# Patient Record
Sex: Male | Born: 1985 | Race: White | Hispanic: No | Marital: Married | Smoking: Current every day smoker
Health system: Southern US, Community
[De-identification: ages and names within clinical notes are randomized; demographics above are authoritative.]

## PROBLEM LIST (undated history)

## (undated) HISTORY — PX: TENDON REPAIR: SHX5111

---

## 2010-11-02 ENCOUNTER — Emergency Department: Payer: Self-pay | Admitting: Internal Medicine

## 2011-06-10 ENCOUNTER — Emergency Department: Payer: Self-pay | Admitting: Emergency Medicine

## 2014-10-20 ENCOUNTER — Emergency Department (HOSPITAL_COMMUNITY): Payer: 59

## 2014-10-20 ENCOUNTER — Encounter (HOSPITAL_COMMUNITY): Payer: Self-pay | Admitting: Family Medicine

## 2014-10-20 ENCOUNTER — Emergency Department (HOSPITAL_COMMUNITY)
Admission: EM | Admit: 2014-10-20 | Discharge: 2014-10-20 | Disposition: A | Discharge status: Home / Self Care | Payer: 59 | Attending: Emergency Medicine | Admitting: Emergency Medicine

## 2014-10-20 DIAGNOSIS — Y9389 Activity, other specified: Secondary | ICD-10-CM | POA: Diagnosis not present

## 2014-10-20 DIAGNOSIS — Y990 Civilian activity done for income or pay: Secondary | ICD-10-CM | POA: Diagnosis not present

## 2014-10-20 DIAGNOSIS — Z23 Encounter for immunization: Secondary | ICD-10-CM | POA: Insufficient documentation

## 2014-10-20 DIAGNOSIS — Z720 Tobacco use: Secondary | ICD-10-CM | POA: Diagnosis not present

## 2014-10-20 DIAGNOSIS — W11XXXA Fall on and from ladder, initial encounter: Secondary | ICD-10-CM | POA: Insufficient documentation

## 2014-10-20 DIAGNOSIS — S61011A Laceration without foreign body of right thumb without damage to nail, initial encounter: Secondary | ICD-10-CM | POA: Insufficient documentation

## 2014-10-20 DIAGNOSIS — S161XXA Strain of muscle, fascia and tendon at neck level, initial encounter: Secondary | ICD-10-CM | POA: Insufficient documentation

## 2014-10-20 DIAGNOSIS — W19XXXA Unspecified fall, initial encounter: Secondary | ICD-10-CM

## 2014-10-20 DIAGNOSIS — Y9289 Other specified places as the place of occurrence of the external cause: Secondary | ICD-10-CM | POA: Insufficient documentation

## 2014-10-20 MED ORDER — TETANUS-DIPHTH-ACELL PERTUSSIS 5-2.5-18.5 LF-MCG/0.5 IM SUSP
0.5000 mL | Freq: Once | INTRAMUSCULAR | Status: AC
  Administered 2014-10-20: 0.5 mL via INTRAMUSCULAR
  Filled 2014-10-20: qty 0.5

## 2014-10-20 MED ORDER — HYDROCODONE-ACETAMINOPHEN 5-325 MG PO TABS
1.0000 | ORAL_TABLET | Freq: Once | ORAL | Status: AC
  Administered 2014-10-20: 1 via ORAL
  Filled 2014-10-20: qty 1

## 2014-10-20 MED ORDER — LIDOCAINE HCL (PF) 1 % IJ SOLN
10.0000 mL | Freq: Once | INTRAMUSCULAR | Status: AC
  Administered 2014-10-20: 10 mL
  Filled 2014-10-20: qty 10

## 2014-10-20 MED ORDER — HYDROCODONE-ACETAMINOPHEN 5-325 MG PO TABS: 1.0000 | ORAL_TABLET | Freq: Four times a day (QID) | ORAL | Status: DC | PRN

## 2014-10-20 MED ORDER — NAPROXEN 500 MG PO TABS: 500.0000 mg | ORAL_TABLET | Freq: Two times a day (BID) | ORAL | Status: DC | PRN

## 2014-10-20 NOTE — ED Provider Notes (Signed)
CSN: [id number]     Arrival date & time 10/20/14  1731 History  This chart was scribed for non-physician practitioner, Allen Derry, PA-C working with Arby Barrette, MD, by Abel Presto, ED Scribe. This patient was seen in room TR06C/TR06C and the patient's care was started at 6:15 PM.        Chief Complaint  Patient presents with  . Fall  . Laceration     Patient is a 29 y.o. male presenting with fall and skin laceration. The history is provided by the patient. No language interpreter was used.  Fall This is a new problem. The current episode started 3 to 5 hours ago. The problem occurs constantly. The problem has not changed since onset.Pertinent negatives include no chest pain, no abdominal pain, no headaches and no shortness of breath. Exacerbated by: movement. Nothing relieves the symptoms. He has tried ASA for the symptoms. The treatment provided no relief.  Laceration Location:  Finger Finger laceration location:  R thumb Length (cm):  0.5 cm Bleeding: controlled   Time since incident:  3 hours Laceration mechanism:  Metal edge Foreign body present:  Unable to specify Relieved by:  Nothing Worsened by:  Movement Tetanus status:  Unknown  HPI Comments: Jackson May is a 29 y.o. male who presents to the Emergency Department complaining of fall with onset around 3 PM. Pt was on an 8 ft ladder, approximately 6 ft up working on some wire when he cut his right hand on what he was working on causing him to left go, slip and fall. Pt states he landed on his back on a toolbag. He notes associated stinging intermittent 6-7/10 neck pain. He states it feels like a crick in his neck. He states it does not radiate but worsens with movement.  Pt notes associated laceration to right thumb and the bleeding is currently controlled. Pt is able to move thumb. He is not on any blood thinners nor does he have bleeding disorders. He took ASA for relief without relief. Pt is right handed.  He is unsure of his tetanus status. Pt denies head injury, LOC, headaches, lightheadedness, dizziness, vision changes, fever, chills, nausea, vomiting, diarrhea, abdominal pain, constipation, bowel or urinary incontinence, hematochezia, melena, chest pain, SOB, difficulty urinating, dysuria, hematuria, cauda equina symptoms, numbness, tingling, and weakness.    History reviewed. No pertinent past medical history. History reviewed. No pertinent past surgical history. History reviewed. No pertinent family history. History  Substance Use Topics  . Smoking status: Current Every Day Smoker  . Smokeless tobacco: Not on file  . Alcohol Use: No    Review of Systems  Constitutional: Negative for fever and chills.  HENT: Negative for rhinorrhea.   Eyes: Negative for visual disturbance.  Respiratory: Negative for shortness of breath.   Cardiovascular: Negative for chest pain.  Gastrointestinal: Negative for nausea, vomiting, abdominal pain, diarrhea and bowel incontinence.  Genitourinary: Negative for bladder incontinence, dysuria, hematuria and difficulty urinating.  Musculoskeletal: Positive for myalgias, arthralgias (R thumb) and neck pain. Negative for back pain and gait problem.  Skin: Positive for wound.  Allergic/Immunologic: Negative for immunocompromised state.  Neurological: Negative for dizziness, syncope, weakness, light-headedness, numbness and headaches.  Hematological: Does not bruise/bleed easily.   A complete 10 system review of systems was obtained and all systems are negative except as noted in the HPI and PMH.     Allergies  Review of patient's allergies indicates no known allergies.  Home Medications   Prior to Admission medications  Not on File   BP 134/87 mmHg  Pulse 66  Temp(Src) 98.7 F (37.1 C) (Oral)  SpO2 97% Physical Exam  Constitutional: He is oriented to person, place, and time. Vital signs are normal. He appears well-developed and well-nourished.   Non-toxic appearance. No distress. Cervical collar in place.  Afebrile, nontoxic, NAD. C collar in place  HENT:  Head: Normocephalic and atraumatic. Head is without raccoon's eyes, without Battle's sign, without abrasion and without contusion.  Mouth/Throat: Mucous membranes are normal.  [place]/AT, no raccoon eyes or battle's sign, no abrasions or contusions, no scalp tenderness or crepitus  Eyes: Conjunctivae and EOM are normal. Pupils are equal, round, and reactive to light. Right eye exhibits no discharge. Left eye exhibits no discharge.  PERRL, EOMI, no nystagmus, no visual field deficits   Neck: Spinous process tenderness present. No muscular tenderness present.  C collar in place but mild spinous process TTP near C2-3, no bony stepoffs or deformities, no paraspinous muscle TTP or muscle spasms. No bruising or swelling.   Cardiovascular: Normal rate and intact distal pulses.   Pulmonary/Chest: Effort normal. No respiratory distress. He exhibits no tenderness, no crepitus, no deformity and no retraction.  No chest wall tenderness, crepitus, retraction, or deformity  Abdominal: Soft. Normal appearance. He exhibits no distension. There is no tenderness. There is no rigidity, no rebound and no guarding.  Soft, NTND, no r/g/r, no bruising  Musculoskeletal: Normal range of motion.       Thoracic back: Normal.       Lumbar back: Normal.       Right hand: He exhibits tenderness and laceration (0.5 cm). He exhibits normal range of motion, normal two-point discrimination, normal capillary refill and no swelling. Normal sensation noted. Normal strength noted.  Cervical spine as noted above. All other spinal levels nonTTP without step offs or deformities, no bruising or abrasions.  MAE x4 Strength and sensation grossly intact Distal pulses intact R thumb with mild TTP along proximal phalanx with ~0.5 cm V-shaped laceration to dorsal aspect of proximal phalanx, no retained FB, no ongoing bleeding. SEE  PICTURE BELOW. FROM intact in all joints, cap refill brisk and present.  Neurological: He is alert and oriented to person, place, and time. He has normal strength. No cranial nerve deficit or sensory deficit. Gait normal. GCS eye subscore is 4. GCS verbal subscore is 5. GCS motor subscore is 6.  CN 2-12 grossly intact A&O x4 GCS 15 Sensation and strength intact Gait nonataxic Coordination WNL  Skin: Skin is warm and dry. Laceration noted. No rash noted.  V shaped laceration to R thumb, as described above. SEE PICTURE BELOW  Psychiatric: He has a normal mood and affect.  Nursing note and vitals reviewed.     ED Course  LACERATION REPAIR Date/Time: 10/20/2014 7:30 PM Performed by: Allen Derry Authorized by: Allen Derry Consent: Verbal consent obtained. Risks and benefits: risks, benefits and alternatives were discussed Consent given by: patient Patient understanding: patient states understanding of the procedure being performed Patient consent: the patient's understanding of the procedure matches consent given Patient identity confirmed: verbally with patient Body area: upper extremity Location details: right thumb Laceration length: 1 cm Foreign bodies: no foreign bodies Tendon involvement: none Nerve involvement: none Vascular damage: no Anesthesia: local infiltration Local anesthetic: lidocaine 1% without epinephrine Anesthetic total: 1 ml Patient sedated: no Preparation: Patient was prepped and draped in the usual sterile fashion. Irrigation solution: saline Irrigation method: syringe Amount of cleaning: standard Debridement: none Degree  of undermining: none Skin closure: 5-0 Prolene Number of sutures: 1 Technique: simple Approximation: close Approximation difficulty: simple Dressing: 4x4 sterile gauze and splint Patient tolerance: Patient tolerated the procedure well with no immediate complications   (including critical care  time) DIAGNOSTIC STUDIES: Oxygen Saturation is 97% on room air, normal by my interpretation.    COORDINATION OF CARE: 6:23 PM Discussed treatment plan with patient at beside, the patient agrees with the plan and has no further questions at this time.   Labs Review Labs Reviewed - No data to display  Imaging Review Dg Cervical Spine Complete  10/20/2014   CLINICAL DATA:  Initial encounter for 6 foot fall from ladder today onto ground. Neck pain.  EXAM: CERVICAL SPINE  4+ VIEWS  COMPARISON:  11/02/2010.  FINDINGS: There is no evidence of cervical spine fracture or prevertebral soft tissue swelling. Alignment is normal. No other significant bone abnormalities are identified.  IMPRESSION: No cervical spine fracture.  Loss of cervical lordosis. This can be related to patient positioning, muscle spasm or soft tissue injury.   Electronically Signed   By: Kennith CenterEric  Mansell M.D.   On: 10/20/2014 19:04   Dg Finger Thumb Right  10/20/2014   CLINICAL DATA:  Status post fall today. Right thumb pain and laceration. Initial encounter.  EXAM: RIGHT THUMB 2+V  COMPARISON:  None.  FINDINGS: There is no evidence of fracture or dislocation. There is no evidence of arthropathy or other focal bone abnormality. Soft tissues are unremarkable  IMPRESSION: Negative exam.   Electronically Signed   By: Drusilla Kannerhomas  Dalessio M.D.   On: 10/20/2014 19:03     EKG Interpretation None      MDM   Final diagnoses:  Fall  Neck strain, initial encounter  Thumb laceration, right, initial encounter    29 y.o. male here with fall from a ladder, fell onto a toolbag with his back, c/o neck pain and small lac to R thumb. Tenderness to cervical spine and mild tenderness to R thumb, no other tenderness elsewhere. No red flag s/s of back pain. No s/s of central cord compression or cauda equina. Lower extremities are neurovascularly intact and patient is ambulating without difficulty. Will update tetanus, obtain xray imaging, and repair with  sutures. No head inj or LOC, nonfocal neuro, doubt need for head CT. Will give pain meds and reassess.   I personally performed the services described in this documentation, which was scribed in my presence. The recorded information has been reviewed and is accurate.  7:31 PM Xrays neg. Lac repaired. Will have him f/up with UC or his PCP in 7-10 days. Will send home with pain meds. Discussed splint use and RICE. I explained the diagnosis and have given explicit precautions to return to the ER including for any other new or worsening symptoms. The patient understands and accepts the medical plan as it's been dictated and I have answered their questions. Discharge instructions concerning home care and prescriptions have been given. The patient is STABLE and is discharged to home in good condition.  BP 134/87 mmHg  Pulse 66  Temp(Src) 98.7 F (37.1 C) (Oral)  SpO2 97%  Meds ordered this encounter  Medications  . lidocaine (PF) (XYLOCAINE) 1 % injection 10 mL    Sig:   . Tdap (BOOSTRIX) injection 0.5 mL    Sig:   . HYDROcodone-acetaminophen (NORCO/VICODIN) 5-325 MG per tablet 1 tablet    Sig:   . HYDROcodone-acetaminophen (NORCO) 5-325 MG per tablet    Sig:  Take 1 tablet by mouth every 6 (six) hours as needed for severe pain.    Dispense:  6 tablet    Refill:  0    Order Specific Question:  Supervising Provider    Answer:  MILLER, BRIAN [[id number]]  . naproxen (NAPROSYN) 500 MG tablet    Sig: Take 1 tablet (500 mg total) by mouth 2 (two) times daily as needed for mild pain, moderate pain or headache (TAKE WITH MEALS.).    Dispense:  20 tablet    Refill:  0    Order Specific Question:  Supervising Provider    Answer:  Eber Hong [[id number]]    Erhardt Dada Camprubi-Soms, PA-C 10/20/14 1934  Arby Barrette, MD 10/21/14 1620

## 2014-10-20 NOTE — Discharge Instructions (Signed)
Keep wound clean with mild soap and water. Keep area covered with a topical antibiotic ointment and bandage, keep bandage dry, and do not submerge in water for 24 hours. Keep splint on until sutures are removed. Ice and elevate for additional pain relief and swelling. Alternate between Naprosyn and norco for additional pain relief but don't drive or operate machinery while taking this medication. Follow up with your primary care doctor or the [facility] in approximately 7-10 days for wound recheck and suture removal. Monitor area for signs of infection to include, but not limited to: increasing pain, redness, drainage/pus, or swelling. Return to emergency department for emergent changing or worsening symptoms.    Cervical Sprain A cervical sprain is when the tissues (ligaments) that hold the neck bones in place stretch or tear. HOME CARE   Put ice on the injured area.  Put ice in a plastic bag.  Place a towel between your skin and the bag.  Leave the ice on for 15-20 minutes, 3-4 times a day.  You may have been given a collar to wear. This collar keeps your neck from moving while you heal.  Do not take the collar off unless told by your doctor.  If you have long hair, keep it outside of the collar.  Ask your doctor before changing the position of your collar. You may need to change its position over time to make it more comfortable.  If you are allowed to take off the collar for cleaning or bathing, follow your doctor's instructions on how to do it safely.  Keep your collar clean by wiping it with mild soap and water. Dry it completely. If the collar has removable pads, remove them every 1-2 days to hand wash them with soap and water. Allow them to air dry. They should be dry before you wear them in the collar.  Do not drive while wearing the collar.  Only take medicine as told by your doctor.  Keep all doctor visits as told.  Keep all physical therapy visits as  told.  Adjust your work station so that you have good posture while you work.  Avoid positions and activities that make your problems worse.  Warm up and stretch before being active. GET HELP IF:  Your pain is not controlled with medicine.  You cannot take less pain medicine over time as planned.  Your activity level does not improve as expected. GET HELP RIGHT AWAY IF:   You are bleeding.  Your stomach is upset.  You have an allergic reaction to your medicine.  You develop new problems that you cannot explain.  You lose feeling (become numb) or you cannot move any part of your body (paralysis).  You have tingling or weakness in any part of your body.  Your symptoms get worse. Symptoms include:  Pain, soreness, stiffness, puffiness (swelling), or a burning feeling in your neck.  Pain when your neck is touched.  Shoulder or upper back pain.  Limited ability to move your neck.  Headache.  Dizziness.  Your hands or arms feel week, lose feeling, or tingle.  Muscle spasms.  Difficulty swallowing or chewing. MAKE SURE YOU:   Understand these instructions.  Will watch your condition.  Will get help right away if you are not doing well or get worse. Document Released: 09/28/2007 Document Revised: 12/12/2012 Document Reviewed: 10/17/2012 [facility] Patient Information 2015 [place], [place]. This information is not intended to replace advice given to you by your health  care provider. Make sure you discuss any questions you have with your health care provider.  Laceration Care, Adult A laceration is a cut or lesion that goes through all layers of the skin and into the tissue just beneath the skin. TREATMENT  Some lacerations may not require closure. Some lacerations may not be able to be closed due to an increased risk of infection. It is important to see your caregiver as soon as possible after an injury to minimize the risk of infection and maximize the opportunity for  successful closure. If closure is appropriate, pain medicines may be given, if needed. The wound will be cleaned to help prevent infection. Your caregiver will use stitches (sutures), staples, wound glue (adhesive), or skin adhesive strips to repair the laceration. These tools bring the skin edges together to allow for faster healing and a better cosmetic outcome. However, all wounds will heal with a scar. Once the wound has healed, scarring can be minimized by covering the wound with sunscreen during the day for 1 full year. HOME CARE INSTRUCTIONS  For sutures or staples:  Keep the wound clean and dry.  If you were given a bandage (dressing), you should change it at least once a day. Also, change the dressing if it becomes wet or dirty, or as directed by your caregiver.  Wash the wound with soap and water 2 times a day. Rinse the wound off with water to remove all soap. Pat the wound dry with a clean towel.  After cleaning, apply a thin layer of the antibiotic ointment as recommended by your caregiver. This will help prevent infection and keep the dressing from sticking.  You may shower as usual after the first 24 hours. Do not soak the wound in water until the sutures are removed.  Only take over-the-counter or prescription medicines for pain, discomfort, or fever as directed by your caregiver.  Get your sutures or staples removed as directed by your caregiver. For skin adhesive strips:  Keep the wound clean and dry.  Do not get the skin adhesive strips wet. You may bathe carefully, using caution to keep the wound dry.  If the wound gets wet, pat it dry with a clean towel.  Skin adhesive strips will fall off on their own. You may trim the strips as the wound heals. Do not remove skin adhesive strips that are still stuck to the wound. They will fall off in time. For wound adhesive:  You may briefly wet your wound in the shower or bath. Do not soak or scrub the wound. Do not swim. Avoid  periods of heavy perspiration until the skin adhesive has fallen off on its own. After showering or bathing, gently pat the wound dry with a clean towel.  Do not apply liquid medicine, cream medicine, or ointment medicine to your wound while the skin adhesive is in place. This may loosen the film before your wound is healed.  If a dressing is placed over the wound, be careful not to apply tape directly over the skin adhesive. This may cause the adhesive to be pulled off before the wound is healed.  Avoid prolonged exposure to sunlight or tanning lamps while the skin adhesive is in place. Exposure to ultraviolet light in the first year will darken the scar.  The skin adhesive will usually remain in place for 5 to 10 days, then naturally fall off the skin. Do not pick at the adhesive film. You may need a tetanus shot if:  You cannot remember when you had your last tetanus shot.  You have never had a tetanus shot. If you get a tetanus shot, your arm may swell, get red, and feel warm to the touch. This is common and not a problem. If you need a tetanus shot and you choose not to have one, there is a rare chance of getting tetanus. Sickness from tetanus can be serious. SEEK MEDICAL CARE IF:   You have redness, swelling, or increasing pain in the wound.  You see a red line that goes away from the wound.  You have yellowish-white fluid (pus) coming from the wound.  You have a fever.  You notice a bad smell coming from the wound or dressing.  Your wound breaks open before or after sutures have been removed.  You notice something coming out of the wound such as wood or glass.  Your wound is on your hand or foot and you cannot move a finger or toe. SEEK IMMEDIATE MEDICAL CARE IF:   Your pain is not controlled with prescribed medicine.  You have severe swelling around the wound causing pain and numbness or a change in color in your arm, hand, leg, or foot.  Your wound splits open and  starts bleeding.  You have worsening numbness, weakness, or loss of function of any joint around or beyond the wound.  You develop painful lumps near the wound or on the skin anywhere on your body. MAKE SURE YOU:   Understand these instructions.  Will watch your condition.  Will get help right away if you are not doing well or get worse. Document Released: 04/11/2005 Document Revised: 07/04/2011 Document Reviewed: 10/05/2010 [facility] Patient Information 2015 [place], [place]. This information is not intended to replace advice given to you by your health care provider. Make sure you discuss any questions you have with your health care provider.  Cryotherapy Cryotherapy is when you put ice on your injury. Ice helps lessen pain and puffiness (swelling) after an injury. Ice works the best when you start using it in the first 24 to 48 hours after an injury. HOME CARE  Put a dry or damp towel between the ice pack and your skin.  You may press gently on the ice pack.  Leave the ice on for no more than 10 to 20 minutes at a time.  Check your skin after 5 minutes to make sure your skin is okay.  Rest at least 20 minutes between ice pack uses.  Stop using ice when your skin loses feeling (numbness).  Do not use ice on someone who cannot tell you when it hurts. This includes small children and people with memory problems (dementia). GET HELP RIGHT AWAY IF:  You have white spots on your skin.  Your skin turns blue or pale.  Your skin feels waxy or hard.  Your puffiness gets worse. MAKE SURE YOU:   Understand these instructions.  Will watch your condition.  Will get help right away if you are not doing well or get worse. Document Released: 09/28/2007 Document Revised: 07/04/2011 Document Reviewed: 12/02/2010 [facility] Patient Information 2015 [place], [place]. This information is not intended to replace advice given to you by your health care provider. Make sure you discuss any  questions you have with your health care provider.

## 2014-10-20 NOTE — ED Notes (Signed)
Pt sts he was on a ladder messing with some wire. sts he sliced thumb with the wire and the fell off the ladder. sts some neck soreness. Right thumb

## 2015-04-27 ENCOUNTER — Encounter (HOSPITAL_COMMUNITY): Payer: Self-pay | Admitting: *Deleted

## 2015-04-27 ENCOUNTER — Emergency Department (HOSPITAL_COMMUNITY)
Admission: EM | Admit: 2015-04-27 | Discharge: 2015-04-27 | Disposition: A | Discharge status: Home / Self Care | Payer: 59 | Attending: Emergency Medicine | Admitting: Emergency Medicine

## 2015-04-27 ENCOUNTER — Emergency Department (HOSPITAL_COMMUNITY): Payer: 59

## 2015-04-27 DIAGNOSIS — W010XXA Fall on same level from slipping, tripping and stumbling without subsequent striking against object, initial encounter: Secondary | ICD-10-CM | POA: Insufficient documentation

## 2015-04-27 DIAGNOSIS — F17210 Nicotine dependence, cigarettes, uncomplicated: Secondary | ICD-10-CM | POA: Diagnosis not present

## 2015-04-27 DIAGNOSIS — Y9389 Activity, other specified: Secondary | ICD-10-CM | POA: Diagnosis not present

## 2015-04-27 DIAGNOSIS — S99911A Unspecified injury of right ankle, initial encounter: Secondary | ICD-10-CM | POA: Diagnosis present

## 2015-04-27 DIAGNOSIS — Y998 Other external cause status: Secondary | ICD-10-CM | POA: Diagnosis not present

## 2015-04-27 DIAGNOSIS — Y9289 Other specified places as the place of occurrence of the external cause: Secondary | ICD-10-CM | POA: Insufficient documentation

## 2015-04-27 DIAGNOSIS — Z9889 Other specified postprocedural states: Secondary | ICD-10-CM | POA: Diagnosis not present

## 2015-04-27 DIAGNOSIS — S93401A Sprain of unspecified ligament of right ankle, initial encounter: Secondary | ICD-10-CM | POA: Insufficient documentation

## 2015-04-27 MED ORDER — TRAMADOL HCL 50 MG PO TABS: 50.0000 mg | ORAL_TABLET | Freq: Four times a day (QID) | ORAL | Status: DC | PRN

## 2015-04-27 MED ORDER — NAPROXEN 500 MG PO TABS: 500.0000 mg | ORAL_TABLET | Freq: Two times a day (BID) | ORAL | Status: DC

## 2015-04-27 NOTE — ED Notes (Signed)
Pt reports slipping and falling on porch this am. C/o right ankle pain.

## 2015-04-27 NOTE — ED Notes (Signed)
Pt verbalized understanding of no driving and to use caution within 4 hours of taking pain meds due to meds cause drowsiness 

## 2015-04-27 NOTE — Discharge Instructions (Signed)
Ankle Sprain °An ankle sprain is an injury to the strong, fibrous tissues (ligaments) that hold the bones of your ankle joint together.  °CAUSES °An ankle sprain is usually caused by a fall or by twisting your ankle. Ankle sprains most commonly occur when you step on the outer edge of your foot, and your ankle turns inward. People who participate in sports are more prone to these types of injuries.  °SYMPTOMS  °· Pain in your ankle. The pain may be present at rest or only when you are trying to stand or walk. °· Swelling. °· Bruising. Bruising may develop immediately or within 1 to 2 days after your injury. °· Difficulty standing or walking, particularly when turning corners or changing directions. °DIAGNOSIS  °Your caregiver will ask you details about your injury and perform a physical exam of your ankle to determine if you have an ankle sprain. During the physical exam, your caregiver will press on and apply pressure to specific areas of your foot and ankle. Your caregiver will try to move your ankle in certain ways. An X-ray exam may be done to be sure a bone was not broken or a ligament did not separate from one of the bones in your ankle (avulsion fracture).  °TREATMENT  °Certain types of braces can help stabilize your ankle. Your caregiver can make a recommendation for this. Your caregiver may recommend the use of medicine for pain. If your sprain is severe, your caregiver may refer you to a surgeon who helps to restore function to parts of your skeletal system (orthopedist) or a physical therapist. °HOME CARE INSTRUCTIONS  °· Apply ice to your injury for 1-2 days or as directed by your caregiver. Applying ice helps to reduce inflammation and pain. °¨ Put ice in a plastic bag. °¨ Place a towel between your skin and the bag. °¨ Leave the ice on for 15-20 minutes at a time, every 2 hours while you are awake. °· Only take over-the-counter or prescription medicines for pain, discomfort, or fever as directed by  your caregiver. °· Elevate your injured ankle above the level of your heart as much as possible for 2-3 days. °· If your caregiver recommends crutches, use them as instructed. Gradually put weight on the affected ankle. Continue to use crutches or a cane until you can walk without feeling pain in your ankle. °· If you have a plaster splint, wear the splint as directed by your caregiver. Do not rest it on anything harder than a pillow for the first 24 hours. Do not put weight on it. Do not get it wet. You may take it off to take a shower or bath. °· You may have been given an elastic bandage to wear around your ankle to provide support. If the elastic bandage is too tight (you have numbness or tingling in your foot or your foot becomes cold and blue), adjust the bandage to make it comfortable. °· If you have an air splint, you may blow more air into it or let air out to make it more comfortable. You may take your splint off at night and before taking a shower or bath. Wiggle your toes in the splint several times per day to decrease swelling. °SEEK MEDICAL CARE IF:  °· You have rapidly increasing bruising or swelling. °· Your toes feel extremely cold or you lose feeling in your foot. °· Your pain is not relieved with medicine. °SEEK IMMEDIATE MEDICAL CARE IF: °· Your toes are numb or blue. °·   You have severe pain that is increasing. MAKE SURE YOU:   Understand these instructions.  Will watch your condition.  Will get help right away if you are not doing well or get worse.   This information is not intended to replace advice given to you by your health care provider. Make sure you discuss any questions you have with your health care provider.   Document Released: 04/11/2005 Document Revised: 05/02/2014 Document Reviewed: 04/23/2011 Elsevier Interactive Patient Education 2016 [employer]   Wear the ASO and use crutches to avoid weight bearing.  Use ice and elevation as much as possible for the next  several days to help reduce the swelling.  Take the medications prescribed.  You may take the tramadol prescribed for pain relief.  This will make you drowsy - do not drive within 4 hours of taking this medication.  Use the ibuprofen also for inflammation.  Call the orthopedic doctor listed for a recheck of your injury in 10 days if your symptoms persist.

## 2015-04-27 NOTE — ED Provider Notes (Signed)
CSN: [id number]     Arrival date & time 04/27/15  1236 History   By signing my name below, I, Evon Slackerrance Branch, attest that this documentation has been prepared under the direction and in the presence of Chubb CorporationJulie Jonathyn Carothers, PA-C. Electronically Signed: Evon Slackerrance Branch, ED Scribe. 04/28/2015. 6:07 AM.      Chief Complaint  Patient presents with  . Ankle Pain   The history is provided by the patient. No language interpreter was used.   HPI Comments: Jackson May is a 30 y.o. male who presents to the Emergency Department complaining of sharp right ankle pain onset today PTA. Pt states that he slipped on the porch today due to the rain. Pt states that he is able to bear weight on the leg but its is painful with movement of the ankle. Pt states he has a Hx of right ankle fracture and surgery. He states that he has a Hx of tendon tear with subsequent repair in the right ankle and that the pain feels similar. Pt doesn't report numbness or tingling. He has had no treatment prior to arrival today.  History reviewed. No pertinent past medical history. Past Surgical History  Procedure Laterality Date  . Tendon repair      right ankle   History reviewed. No pertinent family history. Social History  Substance Use Topics  . Smoking status: Current Every Day Smoker -- 0.50 packs/day    Types: Cigarettes  . Smokeless tobacco: None  . Alcohol Use: No    Review of Systems  Musculoskeletal: Positive for arthralgias. Negative for joint swelling.  Neurological: Negative for numbness.    Allergies  Review of patient's allergies indicates no known allergies.  Home Medications   Prior to Admission medications   Medication Sig Start Date End Date Taking? Authorizing Provider  naproxen (NAPROSYN) 500 MG tablet Take 1 tablet (500 mg total) by mouth 2 (two) times daily. 04/27/15   Burgess AmorJulie Kapri Nero, PA-C  traMADol (ULTRAM) 50 MG tablet Take 1 tablet (50 mg total) by mouth every 6 (six) hours as needed. 04/27/15   Burgess AmorJulie  Jeryn Cerney, PA-C   BP 143/77 mmHg  Pulse 60  Temp(Src) 99.7 F (37.6 C) (Tympanic)  Resp 16  Ht 5' (1.524 m)  Wt 45.36 kg  BMI 19.53 kg/m2  SpO2 100%   Physical Exam  Constitutional: He appears well-developed and well-nourished.  HENT:  Head: Normocephalic.  Cardiovascular: Normal rate and intact distal pulses.  Exam reveals no decreased pulses.   Pulses:      Dorsalis pedis pulses are 2+ on the right side, and 2+ on the left side.       Posterior tibial pulses are 2+ on the right side, and 2+ on the left side.  Musculoskeletal: He exhibits tenderness. He exhibits no edema.       Right ankle: He exhibits decreased range of motion. He exhibits no swelling, no ecchymosis and normal pulse. Tenderness. Lateral malleolus tenderness found. No head of 5th metatarsal and no proximal fibula tenderness found. Achilles tendon normal.  Decreased flex/ext which is pt's baseline since prior injury, per pt report.  Neurological: He is alert. No sensory deficit.  Skin: Skin is warm, dry and intact.  Nursing note and vitals reviewed.   ED Course  Procedures (including critical care time) DIAGNOSTIC STUDIES: Oxygen Saturation is 100% on RA, normal by my interpretation.    COORDINATION OF CARE: 3:30 PM-Discussed treatment plan with pt at bedside and pt agreed to plan.     Labs  Review Labs Reviewed - No data to display  Imaging Review Dg Ankle Complete Right  04/27/2015  CLINICAL DATA:  Patient status post fall. Right ankle injury. Initial encounter. EXAM: RIGHT ANKLE - COMPLETE 3+ VIEW COMPARISON:  Ankle radiographs 06/10/2011 FINDINGS: Normal anatomic alignment. No evidence for acute fracture or dislocation. Tailor dome is intact. Remote healed anterior tailor fracture. Soft tissues are unremarkable. Midfoot degenerative changes. IMPRESSION: No acute osseous abnormality. Electronically Signed   By: Annia Belt M.D.   On: 04/27/2015 13:23      EKG Interpretation None      MDM   Final  diagnoses:  Ankle sprain, right, initial encounter        Radiological studies were viewed, interpreted and considered during the medical decision making and disposition process. I agree with radiologists reading.  Results were also discussed with patient. RICE, aso, crutches.  F/u with ortho, referral given, if sx are not improved over the next 10 days.     Burgess Amor, PA-C 04/28/15 1610  Linwood Dibbles, MD 04/30/15 [phone]

## 2016-09-28 ENCOUNTER — Emergency Department (HOSPITAL_COMMUNITY): Payer: BLUE CROSS/BLUE SHIELD

## 2016-09-28 ENCOUNTER — Encounter (HOSPITAL_COMMUNITY): Payer: Self-pay | Admitting: Emergency Medicine

## 2016-09-28 ENCOUNTER — Emergency Department (HOSPITAL_COMMUNITY)
Admission: EM | Admit: 2016-09-28 | Discharge: 2016-09-28 | Disposition: A | Discharge status: Home / Self Care | Payer: BLUE CROSS/BLUE SHIELD | Attending: Emergency Medicine | Admitting: Emergency Medicine

## 2016-09-28 DIAGNOSIS — W208XXA Other cause of strike by thrown, projected or falling object, initial encounter: Secondary | ICD-10-CM | POA: Diagnosis not present

## 2016-09-28 DIAGNOSIS — Y929 Unspecified place or not applicable: Secondary | ICD-10-CM | POA: Insufficient documentation

## 2016-09-28 DIAGNOSIS — S29019A Strain of muscle and tendon of unspecified wall of thorax, initial encounter: Secondary | ICD-10-CM

## 2016-09-28 DIAGNOSIS — Y939 Activity, unspecified: Secondary | ICD-10-CM | POA: Diagnosis not present

## 2016-09-28 DIAGNOSIS — S39012A Strain of muscle, fascia and tendon of lower back, initial encounter: Secondary | ICD-10-CM | POA: Insufficient documentation

## 2016-09-28 DIAGNOSIS — S0990XA Unspecified injury of head, initial encounter: Secondary | ICD-10-CM | POA: Diagnosis present

## 2016-09-28 DIAGNOSIS — F17210 Nicotine dependence, cigarettes, uncomplicated: Secondary | ICD-10-CM | POA: Insufficient documentation

## 2016-09-28 DIAGNOSIS — T1490XA Injury, unspecified, initial encounter: Secondary | ICD-10-CM

## 2016-09-28 DIAGNOSIS — Y999 Unspecified external cause status: Secondary | ICD-10-CM | POA: Insufficient documentation

## 2016-09-28 DIAGNOSIS — Z79899 Other long term (current) drug therapy: Secondary | ICD-10-CM | POA: Diagnosis not present

## 2016-09-28 DIAGNOSIS — S0101XA Laceration without foreign body of scalp, initial encounter: Secondary | ICD-10-CM | POA: Diagnosis not present

## 2016-09-28 DIAGNOSIS — S098XXA Other specified injuries of head, initial encounter: Secondary | ICD-10-CM

## 2016-09-28 MED ORDER — LIDOCAINE-EPINEPHRINE (PF) 2 %-1:200000 IJ SOLN
10.0000 mL | Freq: Once | INTRAMUSCULAR | Status: DC
  Filled 2016-09-28: qty 20

## 2016-09-28 NOTE — Discharge Instructions (Signed)
Staples need to be removed in 7-10 days. That can be done here, at an urgent care center, or at a doctor's office.  Take ibuprofen or naproxen as needed for pain.

## 2016-09-28 NOTE — ED Provider Notes (Signed)
[facility]EMERGENCY DEPT Provider Note   CSN: [id number] Arrival date & time: 09/28/16  1341     History   Chief Complaint Chief Complaint  Patient presents with  . Head Injury    HPI Jackson May is a 31 y.o. male.  The history is provided by the patient.  He states that a window air conditioning unit fell from a second story window striking him on the back of his head. He was momentarily dazed but there was no loss of consciousness. There was initial nausea which has resolved. Initially, he was not aware of any pain anywhere besides the back of his head, but he is now complaining of some pain in his mid cervical region and upper thoracic region. He denies weakness, numbness, tingling. He has been ambulatory. Last tetanus immunization was in 2016. He currently rates his pain at 8/10. Pain is worse with movement.  History reviewed. No pertinent past medical history.  There are no active problems to display for this patient.   Past Surgical History:  Procedure Laterality Date  . TENDON REPAIR     right ankle       Home Medications    Prior to Admission medications   Medication Sig Start Date End Date Taking? Authorizing Provider  naproxen (NAPROSYN) 500 MG tablet Take 1 tablet (500 mg total) by mouth 2 (two) times daily. 04/27/15   Burgess AmorIdol, Julie, PA-C  traMADol (ULTRAM) 50 MG tablet Take 1 tablet (50 mg total) by mouth every 6 (six) hours as needed. 04/27/15   Burgess AmorIdol, Julie, PA-C    Family History History reviewed. No pertinent family history.  Social History Social History  Substance Use Topics  . Smoking status: Current Every Day Smoker    Packs/day: 0.50    Types: Cigarettes  . Smokeless tobacco: Not on file  . Alcohol use No     Allergies   Patient has no known allergies.   Review of Systems Review of Systems  All other systems reviewed and are negative.    Physical Exam Updated Vital Signs BP (!) 144/92 (BP Location: Right Arm)   Pulse 82   Temp 98.8 F  (37.1 C) (Oral)   Resp 18   Ht 5\' 1"  (1.549 m)   Wt 44 kg (97 lb)   SpO2 100%   BMI 18.33 kg/m   Physical Exam  Nursing note and vitals reviewed.  31 year old male, resting comfortably and in no acute distress. Vital signs are significant for borderline hypertension. Oxygen saturation is 100%, which is normal. Head is normocephalic. There is a linear laceration at the base of the occiput. PERRLA, EOMI. Oropharynx is clear. Neck has minimal tenderness in the mid and lower cervical region without adenopathy or JVD. Back has mild tenderness in the interscapular area. There is no CVA tenderness. Lungs are clear without rales, wheezes, or rhonchi. Chest is nontender. Heart has regular rate and rhythm without murmur. Abdomen is soft, flat, nontender without masses or hepatosplenomegaly and peristalsis is normoactive. Extremities have no cyanosis or edema, full range of motion is present. Skin is warm and dry without rash. Neurologic: Mental status is normal, cranial nerves are intact, there are no motor or sensory deficits.  ED Treatments / Results   Radiology Dg Cervical Spine Complete  Result Date: 09/28/2016 CLINICAL DATA:  Neck stiffness since a window unit air conditioner fell on the patient today. Initial encounter. EXAM: CERVICAL SPINE - COMPLETE 4+ VIEW COMPARISON:  Plain film cervical spine 10/20/2014. FINDINGS: There  is no evidence of cervical spine fracture or prevertebral soft tissue swelling. Alignment is normal. No other significant bone abnormalities are identified. IMPRESSION: Negative cervical spine radiographs. Electronically Signed   By: Drusilla Kanner M.D.   On: 09/28/2016 16:08   Dg Thoracic Spine W/swimmers  Result Date: 09/28/2016 CLINICAL DATA:  Blunt trauma.  Back pain. EXAM: THORACIC SPINE - 3 VIEWS COMPARISON:  None. FINDINGS: There is no evidence of thoracic spine fracture. Alignment is normal. No other significant bone abnormalities are identified. IMPRESSION:  Negative. Electronically Signed   By: Elige Ko   On: 09/28/2016 16:08   Ct Head Wo Contrast  Result Date: 09/28/2016 CLINICAL DATA:  Air conditioning unit fell on head. Nausea and dizziness. Initial encounter. EXAM: CT HEAD WITHOUT CONTRAST CT CERVICAL SPINE WITHOUT CONTRAST TECHNIQUE: Multidetector CT imaging of the head and cervical spine was performed following the standard protocol without intravenous contrast. Multiplanar CT image reconstructions of the cervical spine were also generated. COMPARISON:  Cervical spine radiography 10/20/2014 FINDINGS: CT HEAD FINDINGS Brain: Normal. No evidence of acute infarction, hemorrhage, hydrocephalus, extra-axial collection or mass lesion/mass effect. Vascular: Negative. Skull: Negative for fracture Sinuses/Orbits: Negative CT CERVICAL SPINE FINDINGS Alignment: No traumatic malalignment. Skull base and vertebrae: Negative for fracture Soft tissues and spinal canal: No prevertebral fluid or swelling. No visible canal hematoma. Disc levels: No significant degenerative changes or evidence of impingement. Upper chest: Negative IMPRESSION: No evidence of intracranial or cervical spine injury. Electronically Signed   By: Marnee Spring M.D.   On: 09/28/2016 16:48   Ct Cervical Spine Wo Contrast  Result Date: 09/28/2016 CLINICAL DATA:  Air conditioning unit fell on head. Nausea and dizziness. Initial encounter. EXAM: CT HEAD WITHOUT CONTRAST CT CERVICAL SPINE WITHOUT CONTRAST TECHNIQUE: Multidetector CT imaging of the head and cervical spine was performed following the standard protocol without intravenous contrast. Multiplanar CT image reconstructions of the cervical spine were also generated. COMPARISON:  Cervical spine radiography 10/20/2014 FINDINGS: CT HEAD FINDINGS Brain: Normal. No evidence of acute infarction, hemorrhage, hydrocephalus, extra-axial collection or mass lesion/mass effect. Vascular: Negative. Skull: Negative for fracture Sinuses/Orbits: Negative CT  CERVICAL SPINE FINDINGS Alignment: No traumatic malalignment. Skull base and vertebrae: Negative for fracture Soft tissues and spinal canal: No prevertebral fluid or swelling. No visible canal hematoma. Disc levels: No significant degenerative changes or evidence of impingement. Upper chest: Negative IMPRESSION: No evidence of intracranial or cervical spine injury. Electronically Signed   By: Marnee Spring M.D.   On: 09/28/2016 16:48    Procedures Procedures  LACERATION REPAIR Performed by: RUEAV,[email or web address] Authorized by: XBJYN,WGNFA Consent: Verbal consent obtained. Risks and benefits: risks, benefits and alternatives were discussed Consent given by: patient Patient identity confirmed: provided demographic data Prepped and Draped in normal sterile fashion Wound explored  Laceration Location: Scalp  Laceration Length: 1.5 cm  No Foreign Bodies seen or palpated  Anesthesia: None   Amount of cleaning: standard  Skin closure: Close   Number of staples: 3  Technique: Surgical stapling   Patient tolerance: Patient tolerated the procedure well with no immediate complications.  Medications Ordered in ED Medications  lidocaine-EPINEPHrine (XYLOCAINE W/EPI) 2 %-1:200000 (PF) injection 10 mL (not administered)     Initial Impression / Assessment and Plan / ED Course  I have reviewed the triage vital signs and the nursing notes.  Pertinent labs & imaging results that were available during my care of the patient were reviewed by me and considered in my medical decision making (see chart for details).  Blunt head and neck trauma with laceration. No evidence of significant closed head injury.CT of head and cervical spine were ordered from triage. He is sent for plain x-rays of thoracic spine. Review of old records confirms ED visit in 2016 with laceration and administration of Tdap.  CT and x-ray are unremarkable. Laceration is closed with surgical staples. He is advised to use  over-the-counter analgesics as needed for pain, advised to use ice on areas that are painful. Staples are to be removed in one week.  Final Clinical Impressions(s) / ED Diagnoses   Final diagnoses:  Blunt trauma  Blunt head trauma, initial encounter  Scalp laceration, initial encounter  Acute thoracic myofascial strain, initial encounter    New Prescriptions New Prescriptions   No medications on file     Dione Booze, MD 09/28/16 [phone]

## 2016-09-28 NOTE — ED Triage Notes (Addendum)
Pt [facility] story window and hit the back of his head.  Pt denies LOC. Pt denies neck pain at this time but did initially. Pt has small laceration to posterior head, denies blood thinner use. Pt had nausea, dizziness, and vision changes at first when event happened, but none at this time.

## 2016-09-28 NOTE — ED Notes (Signed)
c-collar applied in triage

## 2017-01-05 ENCOUNTER — Encounter (HOSPITAL_COMMUNITY): Payer: Self-pay

## 2017-01-05 ENCOUNTER — Emergency Department (HOSPITAL_COMMUNITY)
Admission: EM | Admit: 2017-01-05 | Discharge: 2017-01-05 | Disposition: A | Discharge status: Home / Self Care | Payer: BLUE CROSS/BLUE SHIELD | Attending: Emergency Medicine | Admitting: Emergency Medicine

## 2017-01-05 ENCOUNTER — Emergency Department (HOSPITAL_COMMUNITY): Payer: BLUE CROSS/BLUE SHIELD

## 2017-01-05 DIAGNOSIS — Y939 Activity, unspecified: Secondary | ICD-10-CM | POA: Diagnosis not present

## 2017-01-05 DIAGNOSIS — Y998 Other external cause status: Secondary | ICD-10-CM | POA: Insufficient documentation

## 2017-01-05 DIAGNOSIS — F17210 Nicotine dependence, cigarettes, uncomplicated: Secondary | ICD-10-CM | POA: Insufficient documentation

## 2017-01-05 DIAGNOSIS — Y33XXXA Other specified events, undetermined intent, initial encounter: Secondary | ICD-10-CM | POA: Diagnosis not present

## 2017-01-05 DIAGNOSIS — Y929 Unspecified place or not applicable: Secondary | ICD-10-CM | POA: Insufficient documentation

## 2017-01-05 DIAGNOSIS — M25571 Pain in right ankle and joints of right foot: Secondary | ICD-10-CM

## 2017-01-05 MED ORDER — IBUPROFEN 800 MG PO TABS: 800.0000 mg | ORAL_TABLET | Freq: Three times a day (TID) | ORAL | 0 refills | Status: AC

## 2017-01-05 NOTE — Discharge Instructions (Signed)
Take ibuprofen 3 times a day with meals. Do not take other anti-inflammatories at the same time (Advil, Motrin, naproxen, Aleve). You may supplement with Tylenol as needed for further pain control. Use ice to help with pain and swelling. Ice your knee, for 20 minutes at a time, multiple times throughout the day.  Use Ace wrap and crutches as needed for symptom control. Follow-up with orthopedics for further evaluation of your ankle. Return to the emergency room if you have change in the color of your foot, inability to move your toes, or any new or worsening symptoms

## 2017-01-05 NOTE — ED Triage Notes (Signed)
Patient complains of right foot and ankle pain after ladder slid down this am, pain with ambulation, no obvious distress

## 2017-01-05 NOTE — ED Provider Notes (Signed)
[facility]-EMERGENCY DEPT Provider Note   CSN: [id number] Arrival date & time: 01/05/17  1120     History   Chief Complaint No chief complaint on file.   HPI Jackson May is a 31 y.o. male presenting with R ankle pain.   Pt states he was on an extendable ladder when it retracted several rungs as he was on it. His R ankle got caught between 2 rungs, and he fell backwards, so he was hanging backwards suspended by his hyper-plantarflexed ankle. He had cute onset lateral and posterior ankle pain. Walking and palption make it worse. He has not tried anything to make it better. He denies injury elsewhere. He denies numbness or tingling. He is not on blood thinners. He has injured this ankle before, requiring surgical tendon repair. He does not remember the name of his surgeon, but it was at [facility].    HPI  History reviewed. No pertinent past medical history.  There are no active problems to display for this patient.   Past Surgical History:  Procedure Laterality Date  . TENDON REPAIR     right ankle       Home Medications    Prior to Admission medications   Medication Sig Start Date End Date Taking? Authorizing Provider  acetaminophen (TYLENOL) 325 MG tablet Take 650 mg by mouth once as needed for mild pain.    [provider]  ibuprofen (ADVIL,MOTRIN) 800 MG tablet Take 1 tablet (800 mg total) by mouth 3 (three) times daily with meals. 01/05/17 01/15/17  Kimley Apsey, PA-C    Family History No family history on file.  Social History Social History  Substance Use Topics  . Smoking status: Current Every Day Smoker    Packs/day: 0.50    Types: Cigarettes  . Smokeless tobacco: Not on file  . Alcohol use No     Allergies   Patient has no known allergies.   Review of Systems Review of Systems  Musculoskeletal: Positive for arthralgias and joint swelling.  Neurological: Negative for numbness.  Hematological: Does not bruise/bleed easily.     Physical  Exam Updated Vital Signs BP (!) 143/107 (BP Location: Right Arm)   Pulse 82   Temp 99.7 F (37.6 C) (Oral)   Resp 17   SpO2 98%   Physical Exam  Constitutional: He is oriented to person, place, and time. He appears well-developed and well-nourished. No distress.  HENT:  Head: Normocephalic and atraumatic.  Eyes: EOM are normal.  Neck: Normal range of motion.  Pulmonary/Chest: Effort normal.  Abdominal: He exhibits no distension.  Musculoskeletal: Normal range of motion.  Swelling of lateral R ankl.e TTP of lateral malleolus and surrounding ligaments, Minimal TTP of Achilles tendon, but tendon is palpable and intact. Negative thompson's. No pain of medial ankle. No TTP of the foot. Pt with FROM of toes without pain, Pedal pulses intact bilaterally. Sensation intact bilaterally. Color and warmth equal bilaterally. Soft compartments. No open injury/laceration. Pt not ambulatory due to pain.   Neurological: He is alert and oriented to person, place, and time.  Skin: Skin is warm. No rash noted.  Psychiatric: He has a normal mood and affect.  Nursing note and vitals reviewed.    ED Treatments / Results  Labs (all labs ordered are listed, but only abnormal results are displayed) Labs Reviewed - No data to display  EKG  EKG Interpretation None       Radiology Dg Ankle Complete Right  Result Date: 01/05/2017 CLINICAL DATA:  Right  foot and ankle pain after trauma this morning 1 and extension ladder sleeve down. Patient reports lateral swelling and posterior pain and pain with weight-bearing. EXAM: RIGHT ANKLE - COMPLETE 3+ VIEW COMPARISON:  Right ankle series dated June 10, 2011. FINDINGS: The ankle joint mortise is preserved. The talar dome is intact. There is no acute malleolar fracture. The talus and calcaneus are intact. There is mild soft tissue swelling diffusely. A previously demonstrated fracture of the medial aspect of the talus is no longer evident. IMPRESSION: No acute  bony abnormality of the right ankle. There is mild soft tissue swelling. Electronically Signed   By: David  [place] M.D.   On: 01/05/2017 12:29   Dg Foot Complete Right  Result Date: 01/05/2017 CLINICAL DATA:  Right foot and ankle pain after injury this morning when a ladder slid onto the lower extremity. Pain with weight-bearing. EXAM: RIGHT FOOT COMPLETE - 3+ VIEW COMPARISON:  Right ankle series of today's date FINDINGS: The bones are subjectively adequately mineralized. No acute fracture of the phalanges or metatarsals is observed. The tarsal bones also appear intact. The joint spaces are well maintained. The soft tissues are normal. IMPRESSION: There is no acute or significant chronic bony abnormality of the right foot. Electronically Signed   By: David  [place] M.D.   On: 01/05/2017 12:30    Procedures Procedures (including critical care time)  Medications Ordered in ED Medications - No data to display   Initial Impression / Assessment and Plan / ED Course  I have reviewed the triage vital signs and the nursing notes.  Pertinent labs & imaging results that were available during my care of the patient were reviewed by me and considered in my medical decision making (see chart for details).     Pt presenting with R ankle pain after getting his foot stuck in a ladder while falling. Neurovascularly intact. Xray negative for fx or dislocation. Likely ligamentous/tendon injury. Achilles tendon appears intact. Will tx conservatively with NSAIDs, ice, ace wrap, and crutches. Pt to f/u with ortho. Return precautions given. Pt states he understands and agrees to plan.   Final Clinical Impressions(s) / ED Diagnoses   Final diagnoses:  Acute right ankle pain    New Prescriptions Discharge Medication List as of 01/05/2017  1:43 PM    START taking these medications   Details  ibuprofen (ADVIL,MOTRIN) 800 MG tablet Take 1 tablet (800 mg total) by mouth 3 (three) times daily with meals., Starting  Thu 01/05/2017, Until Sun 01/15/2017, Print         Mischele Detter, PA-C 01/05/17 2145    Loren Racer, MD 01/06/17 [phone]

## 2017-10-16 IMAGING — CT CT HEAD W/O CM
5 of 7 series · 18 of 47 positions shown, 19 images · non-contrast
Comparison: Cervical spine radiography 10/20/2014

CLINICAL DATA: Air conditioning unit fell on head. Nausea and
dizziness. Initial encounter.

EXAM:
CT HEAD WITHOUT CONTRAST
CT CERVICAL SPINE WITHOUT CONTRAST
TECHNIQUE: Multidetector CT imaging of the head and cervical spine was
performed following the standard protocol without intravenous
contrast. Multiplanar CT image reconstructions of the cervical spine
were also generated.

[Series 3: head wo · axial · 0.43mm/px · z∈[+237,+287]mm · 2 of 30 slices shown, 3 images]
[im 10/30  brain]
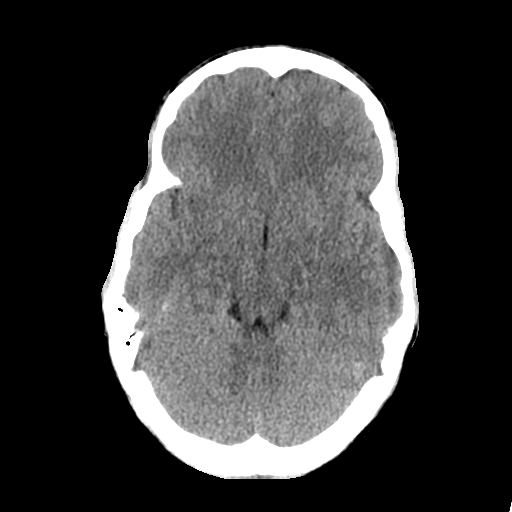
[im 10/30  bone]
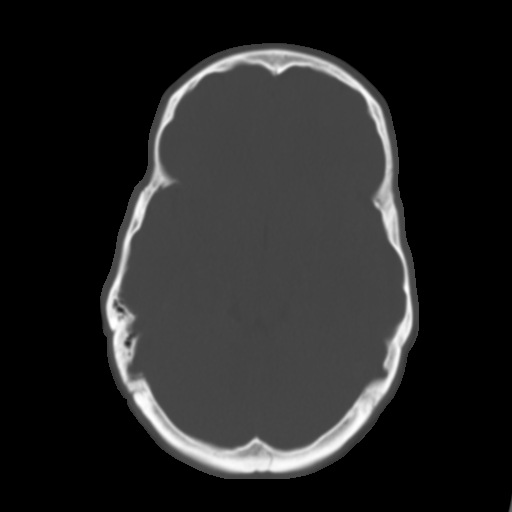
[im 20/30  brain]
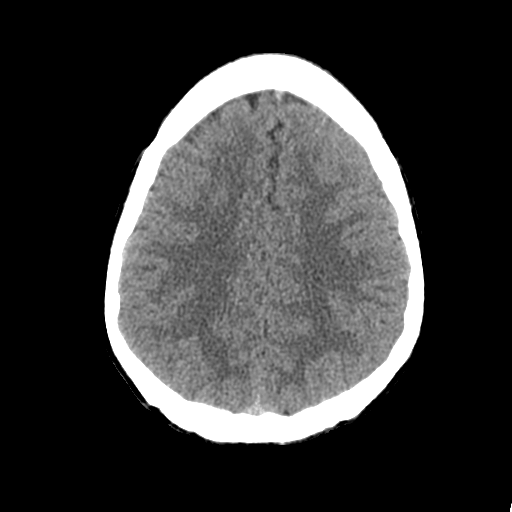

[Series 5: coronal soft tissue · coronal · 0.29mm/px · 3 of 65 slices shown]
[im 5/65  brain]
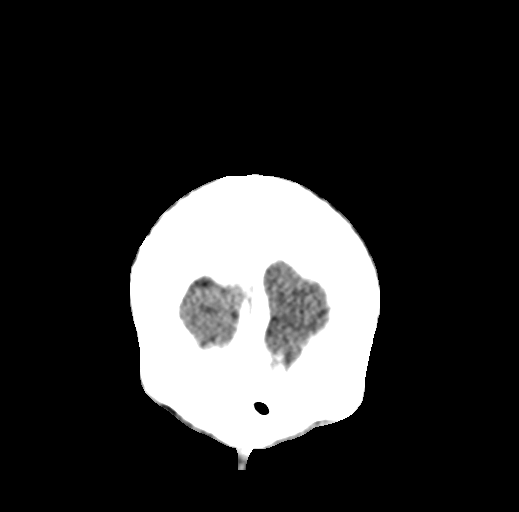
[im 8/65  brain]
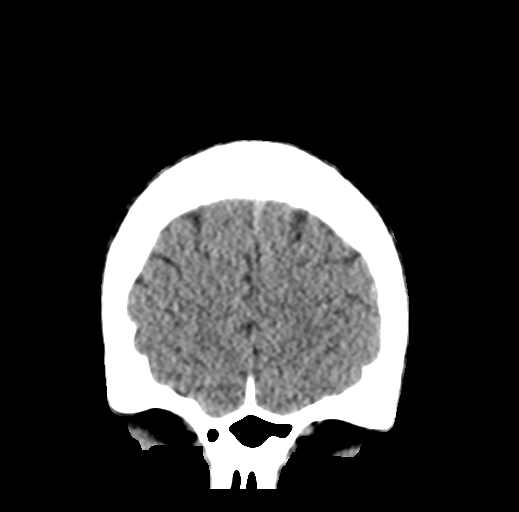
[im 10/65  brain]
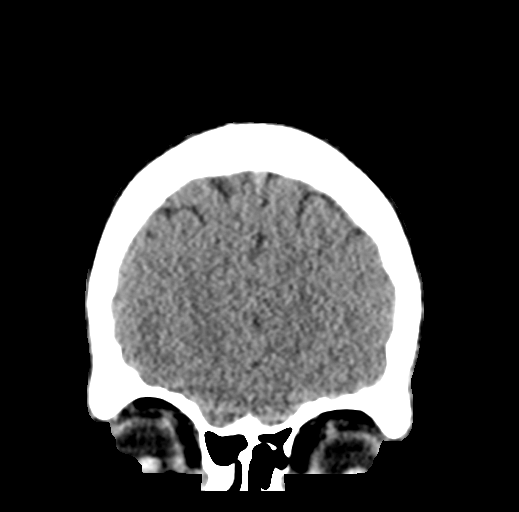

[Series 6: sagittal soft tissue · sagittal · 0.29mm/px · 1 of 49 slices shown]
[im 25/49  brain]
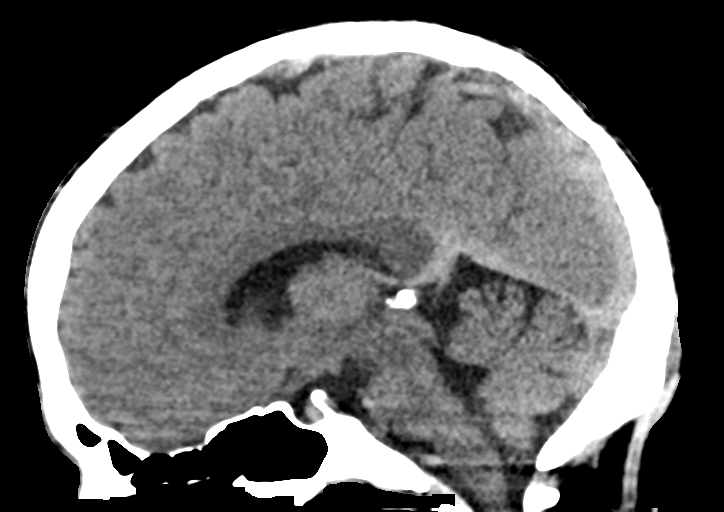

[Series 8: c spine soft · axial · 0.37mm/px · z∈[+96,+152]mm · 4 of 79 slices shown]
[im 8/79  brain]
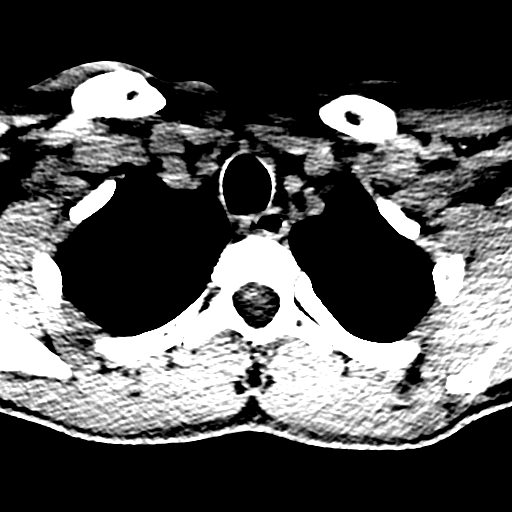
[im 15/79  brain]
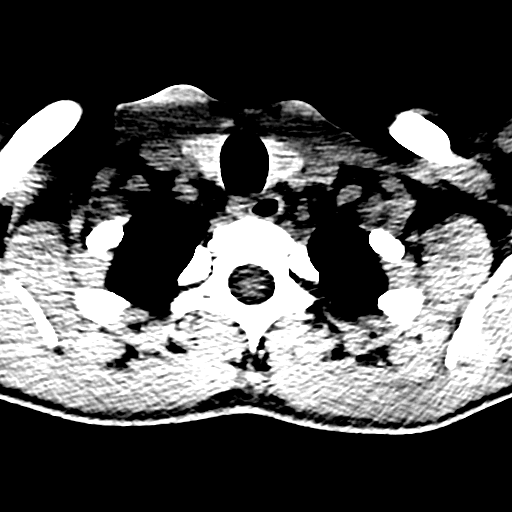
[im 29/79  brain]
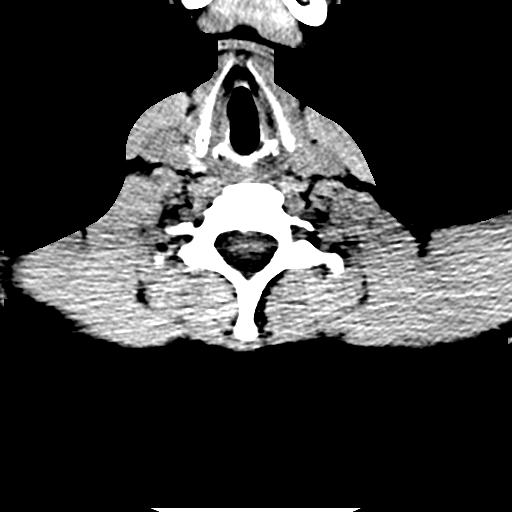
[im 36/79  brain]
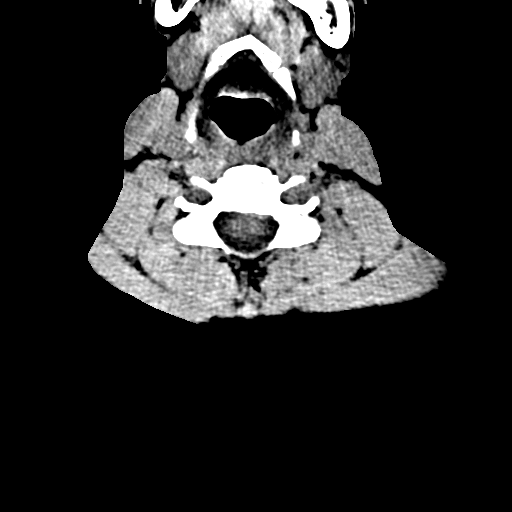

[Series 13: orthogonal bone · axial · 0.21mm/px · z∈[+78,+204]mm · 8 of 91 slices shown]
[im 7/91  bone]
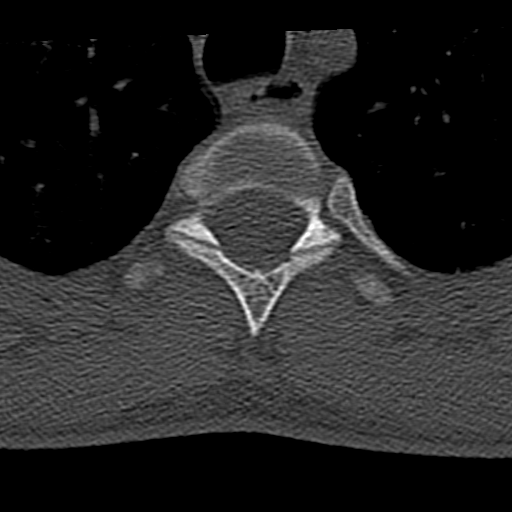
[im 21/91  bone]
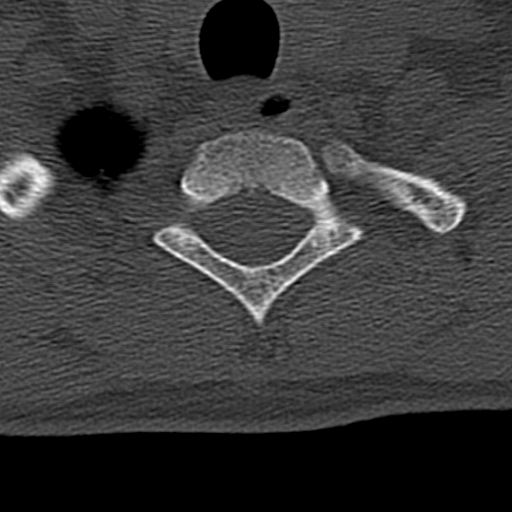
[im 28/91  bone]
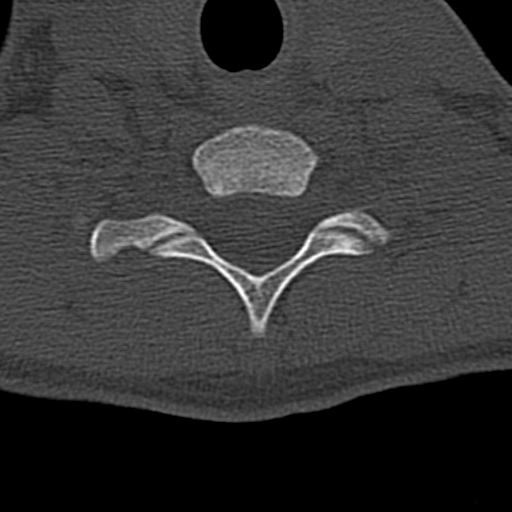
[im 42/91  bone]
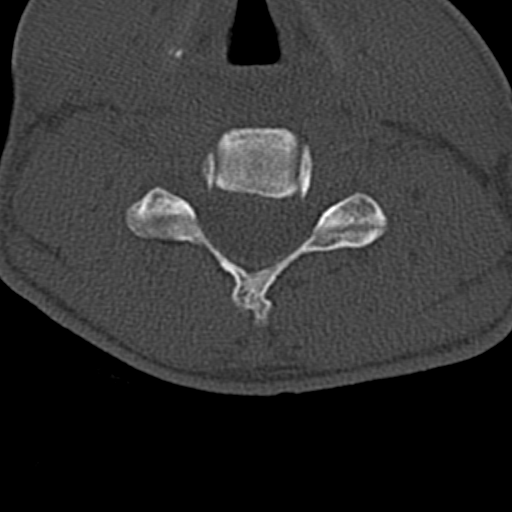
[im 49/91  bone]
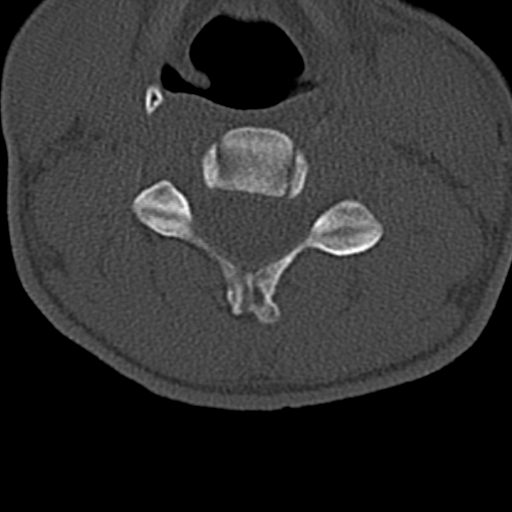
[im 63/91  bone]
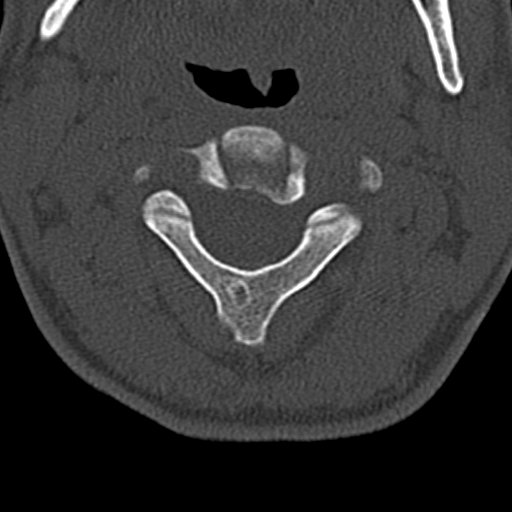
[im 70/91  bone]
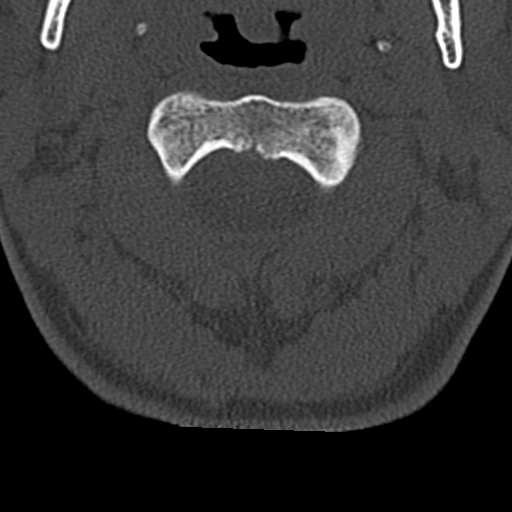
[im 84/91  bone]
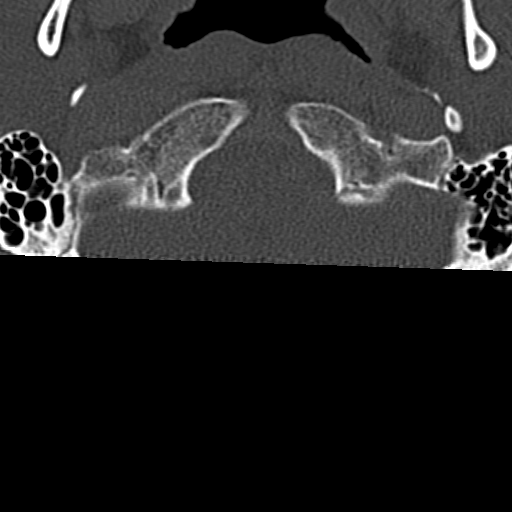

[18 of 47 positions shown; findings below may reference images not displayed]

FINDINGS: CT HEAD FINDINGS

Brain: Normal. No evidence of acute infarction, hemorrhage,
hydrocephalus, extra-axial collection or mass lesion/mass effect.

Vascular: Negative.

Skull: Negative for fracture

Sinuses/Orbits: Negative

CT CERVICAL SPINE FINDINGS

Alignment: No traumatic malalignment.

Skull base and vertebrae: Negative for fracture

Soft tissues and spinal canal: No prevertebral fluid or swelling. No
visible canal hematoma.

Disc levels: No significant degenerative changes or evidence of
impingement.

Upper chest: Negative
IMPRESSION: No evidence of intracranial or cervical spine injury.

## 2018-01-23 IMAGING — CR DG ANKLE COMPLETE 3+V*R*
3 series · 3 of 3 positions shown · non-contrast
Comparison: Right ankle series dated June 10, 2011.

CLINICAL DATA: Right foot and ankle pain after trauma this morning
1 and extension ladder sleeve down. Patient reports lateral swelling
and posterior pain and pain with weight-bearing.

EXAM:
RIGHT ANKLE - COMPLETE 3+ VIEW

[ankle ap]
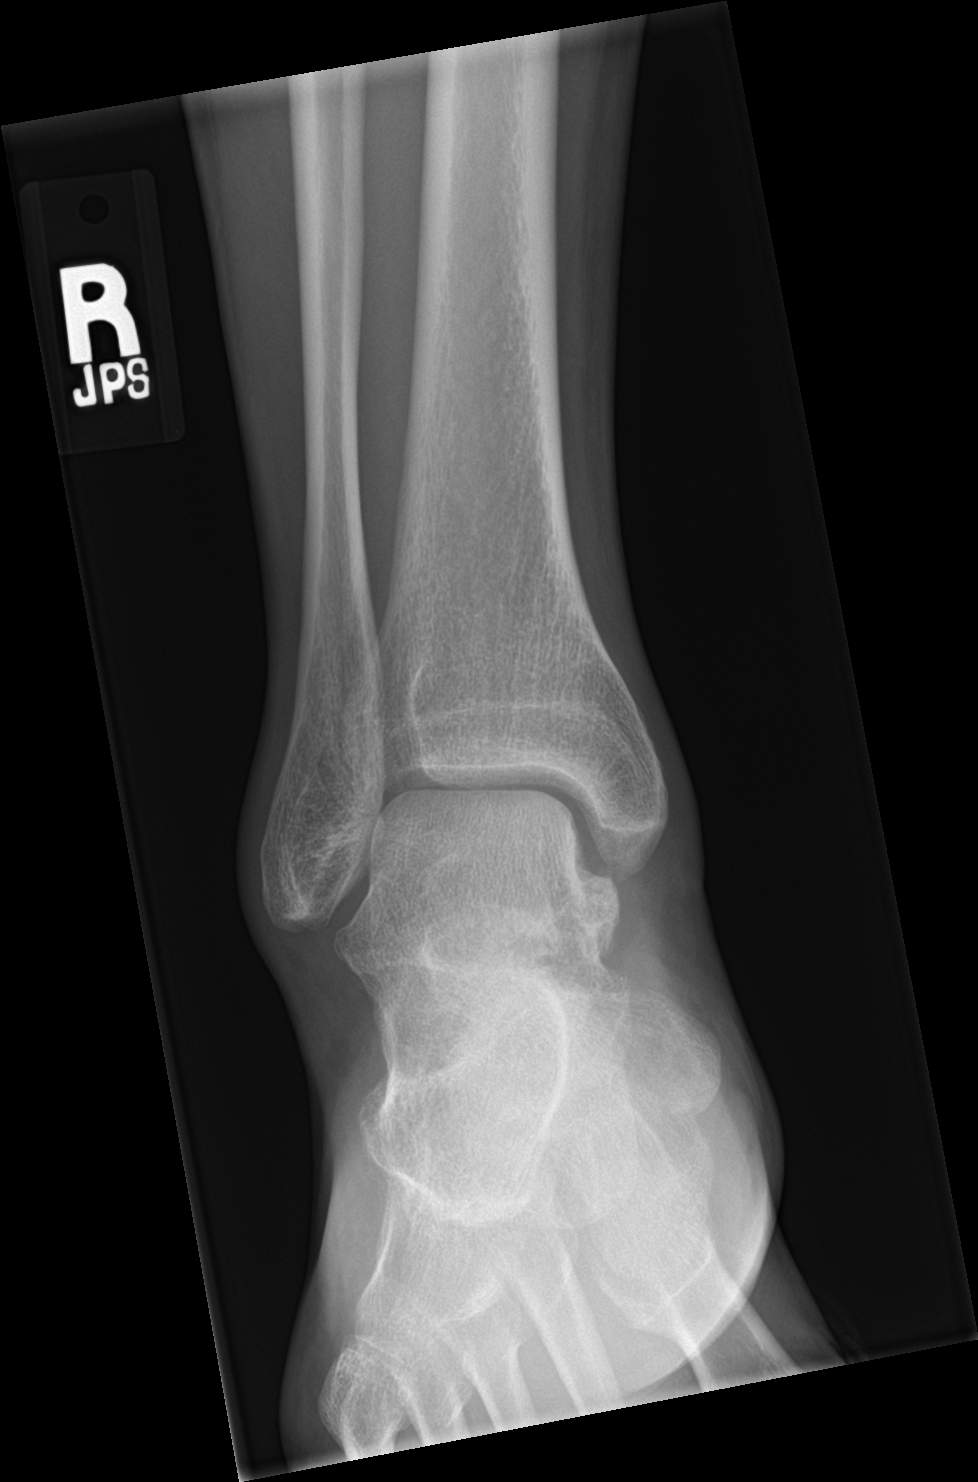

[ankle obl]
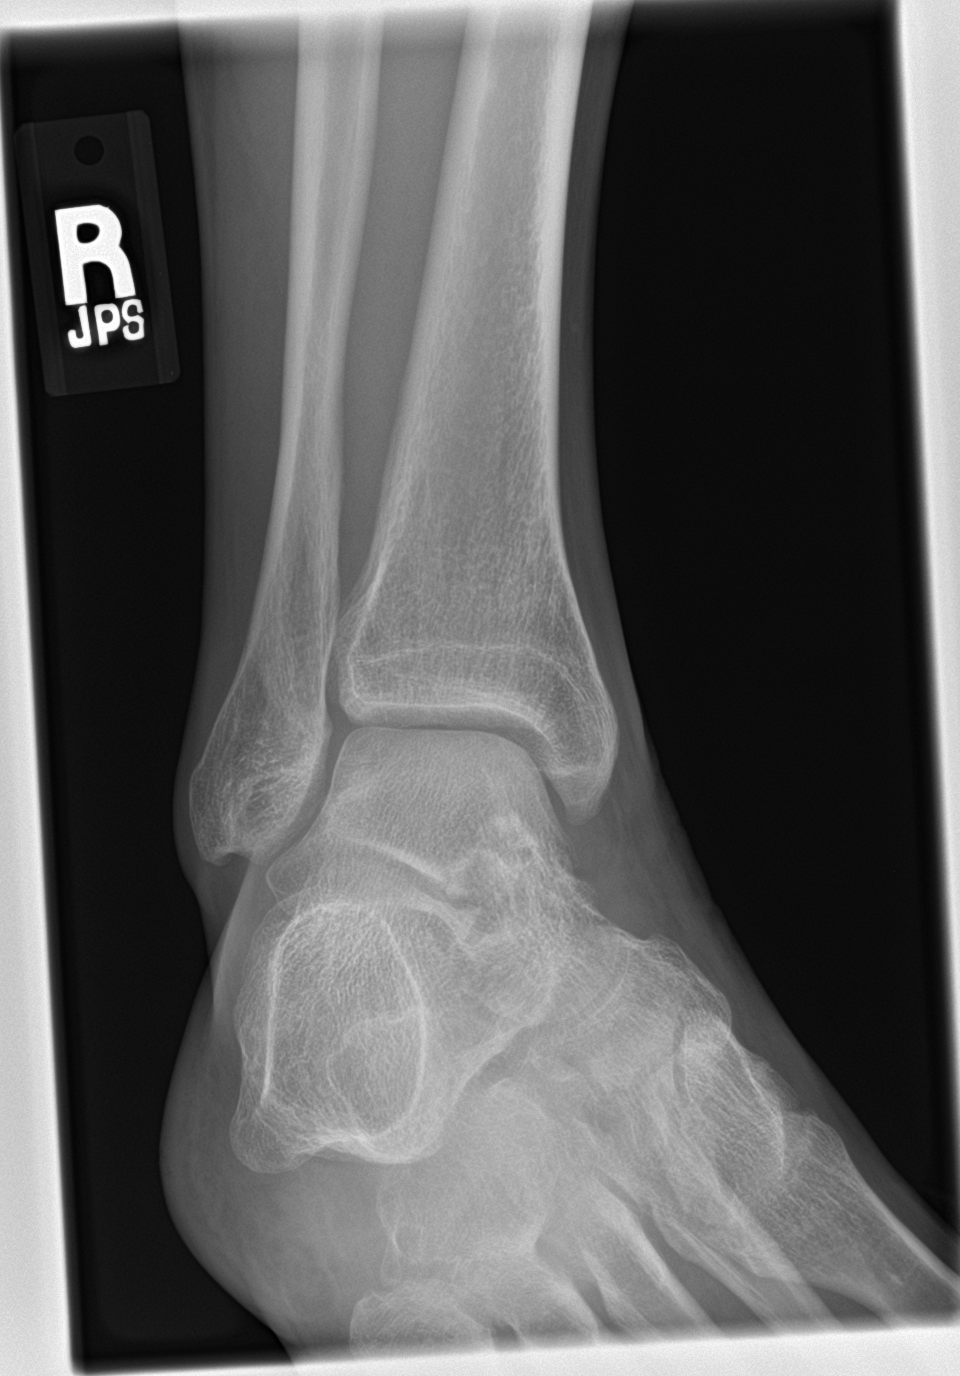

[ankle lat]
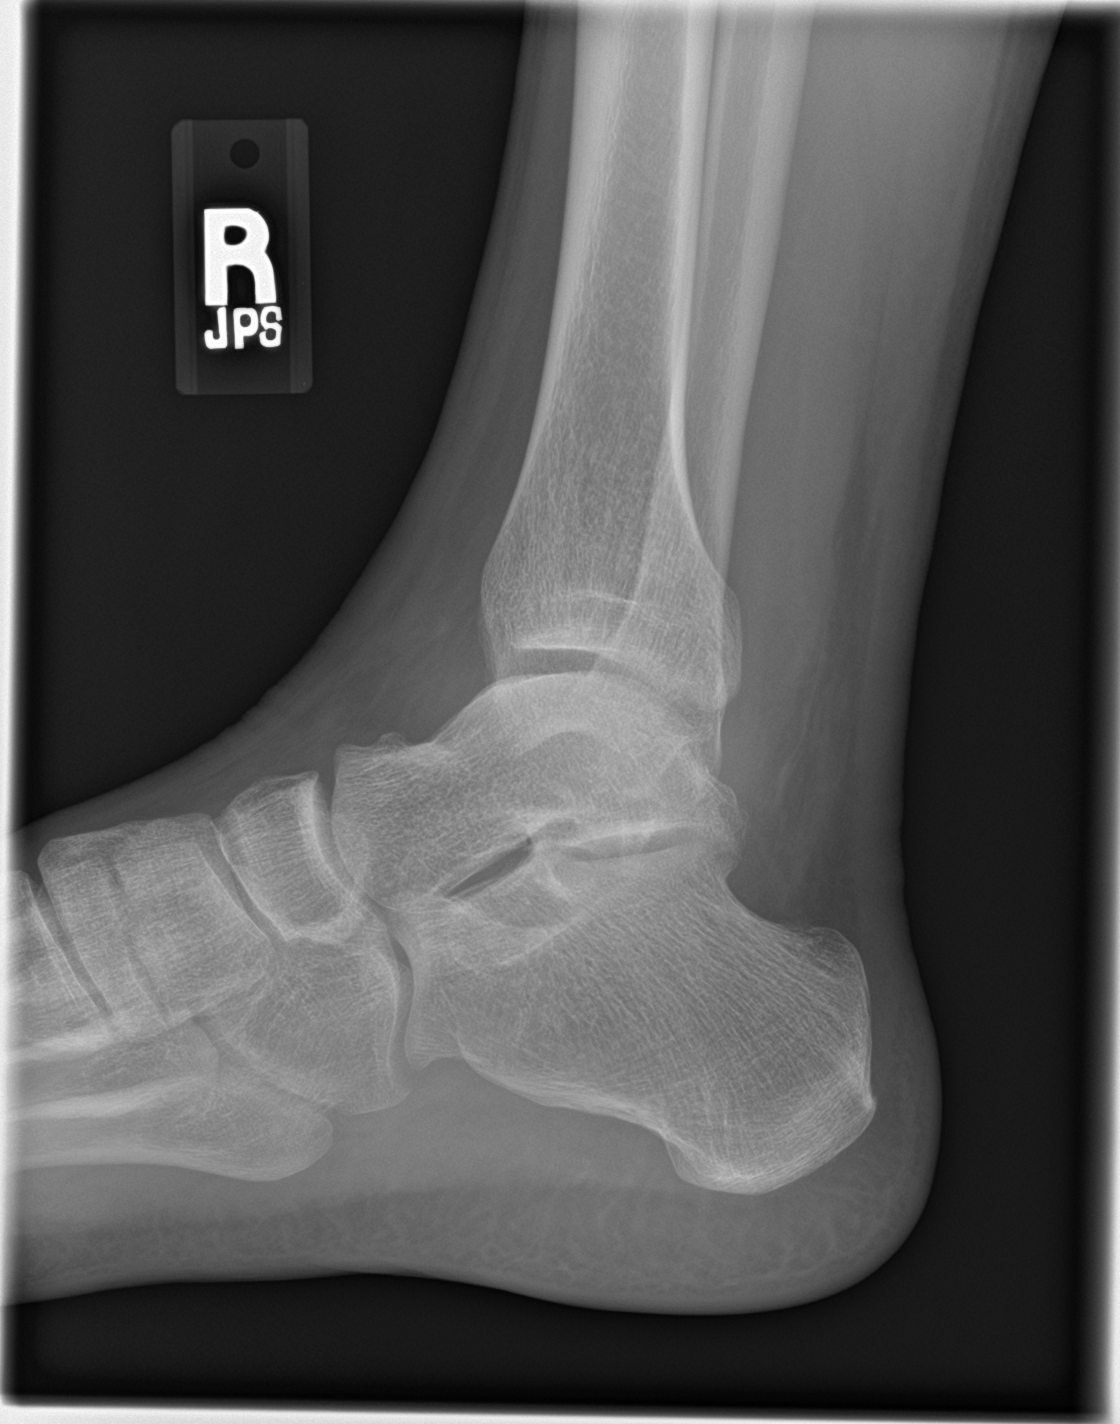

[3 of 3 positions shown; findings below may reference images not displayed]

FINDINGS: The ankle joint mortise is preserved. The talar dome is intact.
There is no acute malleolar fracture. The talus and calcaneus are
intact. There is mild soft tissue swelling diffusely. A previously
demonstrated fracture of the medial aspect of the talus is no longer
evident.
IMPRESSION: No acute bony abnormality of the right ankle. There is mild soft
tissue swelling.

## 2022-09-19 ENCOUNTER — Other Ambulatory Visit: Payer: Self-pay

## 2022-09-19 ENCOUNTER — Encounter: Payer: Self-pay | Admitting: Emergency Medicine

## 2022-09-19 ENCOUNTER — Emergency Department
Admission: EM | Admit: 2022-09-19 | Discharge: 2022-09-19 | Disposition: A | Discharge status: Home / Self Care | Payer: BC Managed Care – PPO | Attending: Emergency Medicine | Admitting: Emergency Medicine

## 2022-09-19 DIAGNOSIS — J069 Acute upper respiratory infection, unspecified: Secondary | ICD-10-CM | POA: Diagnosis not present

## 2022-09-19 DIAGNOSIS — I10 Essential (primary) hypertension: Secondary | ICD-10-CM | POA: Diagnosis not present

## 2022-09-19 DIAGNOSIS — R0981 Nasal congestion: Secondary | ICD-10-CM | POA: Diagnosis present

## 2022-09-19 DIAGNOSIS — R590 Localized enlarged lymph nodes: Secondary | ICD-10-CM | POA: Insufficient documentation

## 2022-09-19 MED ORDER — GUAIFENESIN ER 600 MG PO TB12: 600.0000 mg | ORAL_TABLET | Freq: Two times a day (BID) | ORAL | 0 refills | Status: AC

## 2022-09-19 NOTE — ED Triage Notes (Signed)
PT arrived via POV with reports of "knots" on each side of his neck, pt states he noticed it this morning, but states it has improved since arrival but c/o tenderness.  Pt reports some pain in throat, pt states he has been coughing and has had runny nose as well.

## 2022-09-19 NOTE — ED Provider Notes (Signed)
[facility] Provider Note    Event Date/Time   First MD Initiated Contact with Patient 09/19/22 0900     (approximate)   History   Sore Throat   HPI  Jackson May is a 37 y.o. male  with no history of hypertension  and as listed in EMR presents to the emergency department for treatment and evaluation of "knots" on both sides of his neck upon awakening this morning. They have gone down now. He has has runny nose, cough, and sore throat for the past 5 days, but everything seems to be improving. He denies difficulty swallowing. No fever.      Physical Exam   Triage Vital Signs: ED Triage Vitals  Enc Vitals Group     BP 09/19/22 0853 128/88     Pulse Rate 09/19/22 0853 72     Resp 09/19/22 0853 18     Temp 09/19/22 0853 98 F (36.7 C)     Temp src --      SpO2 09/19/22 0853 97 %     Weight 09/19/22 0857 115 lb (52.2 kg)     Height 09/19/22 0857 5\' 1"  (1.549 m)     Head Circumference --      Peak Flow --      Pain Score 09/19/22 0857 3     Pain Loc --      Pain Edu? --      Excl. in GC? --     Most recent vital signs: Vitals:   09/19/22 0853  BP: 128/88  Pulse: 72  Resp: 18  Temp: 98 F (36.7 C)  SpO2: 97%    General: Awake, no distress.  CV:  Good peripheral perfusion.  Resp:  Normal effort. Breath sounds clear to auscultation. Abd:  No distention.  Other:  No swelling or redness of posterior oropharynx. Tonsils not visualized. Palpable lymph nodes in the anterior cervical chain.    ED Results / Procedures / Treatments   Labs (all labs ordered are listed, but only abnormal results are displayed) Labs Reviewed - No data to display   EKG  Not indicated.   RADIOLOGY  Image and radiology report reviewed and interpreted by me. Radiology report consistent with the same.  Not indicated.  PROCEDURES:  Critical Care performed: No  Procedures   MEDICATIONS ORDERED IN ED:  Medications - No data to  display   IMPRESSION / MDM / ASSESSMENT AND PLAN / ED COURSE   I have reviewed the triage note.  Differential diagnosis includes, but is not limited to, Viral URI, lymphadenopathy, mono, strep throat  Patient's presentation is most consistent with acute, uncomplicated illness.  37 year old male presenting to the emergency department for treatment and evaluation of feeling knots on each side of his neck this morning.  It has since improved.  He has had a URI for the past 4 to 5 days but those symptoms seem to be improving. Cough is occasionally productive.  Today, he has had no fever or difficulty swallowing.  He states that he has roommates noticed the swelling before he did and the symptoms are resolving. Plan will be to prescribe guaifenesin and have him follow up with PCP or return to the ER if needed for symptoms that worsen or are not improving.      FINAL CLINICAL IMPRESSION(S) / ED DIAGNOSES   Final diagnoses:  Upper respiratory tract infection, unspecified type  Lymphadenopathy, anterior cervical     Rx / DC Orders  ED Discharge Orders          Ordered    guaiFENesin (MUCINEX) 600 MG 12 hr tablet  2 times daily        09/19/22 1610             Note:  This document was prepared using Dragon voice recognition software and may include unintentional dictation errors.   Chinita Pester, FNP 09/19/22 9604    Chesley Noon, MD 09/19/22 1451
